# Patient Record
Sex: Female | Born: 2000 | Race: Black or African American | Hispanic: No | Marital: Single | Smoking: Never smoker
Health system: Southern US, Community
[De-identification: ages and names within clinical notes are randomized; demographics above are authoritative.]

## PROBLEM LIST (undated history)

## (undated) DIAGNOSIS — K219 Gastro-esophageal reflux disease without esophagitis: Secondary | ICD-10-CM

## (undated) DIAGNOSIS — IMO0001 Reserved for inherently not codable concepts without codable children: Secondary | ICD-10-CM

## (undated) HISTORY — DX: Gastro-esophageal reflux disease without esophagitis: K21.9

## (undated) HISTORY — DX: Reserved for inherently not codable concepts without codable children: IMO0001

---
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
## 2022-04-15 ENCOUNTER — Ambulatory Visit (HOSPITAL_COMMUNITY)
Admission: EM | Admit: 2022-04-15 | Discharge: 2022-04-15 | Disposition: A | Discharge status: Home / Self Care | Payer: BLUE CROSS/BLUE SHIELD

## 2022-04-15 ENCOUNTER — Encounter (HOSPITAL_COMMUNITY): Payer: Self-pay

## 2022-04-15 DIAGNOSIS — T7840XA Allergy, unspecified, initial encounter: Secondary | ICD-10-CM | POA: Diagnosis not present

## 2022-04-15 MED ORDER — HYDROCORTISONE 1 % EX CREA: TOPICAL_CREAM | CUTANEOUS | 0 refills | Status: AC

## 2022-04-15 MED ORDER — METHYLPREDNISOLONE SODIUM SUCC 125 MG IJ SOLR
INTRAMUSCULAR | Status: AC
  Filled 2022-04-15: qty 2

## 2022-04-15 MED ORDER — METHYLPREDNISOLONE SODIUM SUCC 125 MG IJ SOLR
80.0000 mg | Freq: Once | INTRAMUSCULAR | Status: AC
  Administered 2022-04-15: 125 mg via INTRAMUSCULAR

## 2022-04-15 MED ORDER — PREDNISONE 20 MG PO TABS: 40.0000 mg | ORAL_TABLET | Freq: Every day | ORAL | 0 refills | Status: AC

## 2022-04-15 MED ORDER — CETIRIZINE HCL 10 MG PO TABS: 10.0000 mg | ORAL_TABLET | Freq: Every day | ORAL | 0 refills | Status: AC

## 2022-04-15 MED ORDER — OLOPATADINE HCL 0.1 % OP SOLN: 1.0000 [drp] | Freq: Two times a day (BID) | OPHTHALMIC | 12 refills | Status: AC

## 2022-04-15 NOTE — ED Triage Notes (Signed)
Pt is here for possible allergic reaction to b/p meds since last night . Pt states she has a rash on face , eyes red

## 2022-04-15 NOTE — ED Provider Notes (Signed)
[facility]    CSN: [id number] Arrival date & time: 04/15/22  1222      History   Chief Complaint Chief Complaint  Patient presents with   Allergic Reaction    HPI Tonya West is a 21 y.o. female.  Patient complaining of a generalized rash that started yesterday.  Patient reports she is unaware of what may have caused the rash.  Patient reports itching.  Patient reports bilateral eye redness.  Patient states she was placed on amlodipine several weeks ago but has had no problems with this medication so far.  Patient denies any shortness of breath, chest tightness, or any throat tightness.  Patient reports having a similar episode of symptoms several years ago which was treated as an allergic reaction and resolved, patient states she did not know what caused the allergic reaction that time.  Patient has not used any medications for symptoms.  Patient placed a warm compress on her eyes with no relief of symptoms.    Allergic Reaction Presenting symptoms: rash   Presenting symptoms: no wheezing     Past Medical History:  Diagnosis Date   Reflux     Patient Active Problem List   Diagnosis Date Noted   Diarrhea 08/13/2012   Generalized abdominal pain 08/13/2012   Nausea with vomiting     History reviewed. No pertinent surgical history.  OB History   No obstetric history on file.      Home Medications    Prior to Admission medications   Medication Sig Start Date End Date Taking? Authorizing Provider  amLODipine (NORVASC) 2.5 MG tablet Take 2.5 mg by mouth daily.   Yes [provider]  cetirizine (ZYRTEC ALLERGY) 10 MG tablet Take 1 tablet (10 mg total) by mouth daily. 04/15/22  Yes Debby Freiberg, NP  hydrocortisone cream 1 % Apply to affected area 2 times daily 04/15/22  Yes Kayvion Arneson N, NP  olopatadine (PATADAY) 0.1 % ophthalmic solution Place 1 drop into both eyes 2 (two) times daily. 04/15/22  Yes Debby Freiberg, NP   predniSONE (DELTASONE) 20 MG tablet Take 2 tablets (40 mg total) by mouth daily. 04/15/22  Yes Debby Freiberg, NP    Family History History reviewed. No pertinent family history.  Social History Social History   Tobacco Use   Smoking status: Never   Smokeless tobacco: Never     Allergies   Patient has no known allergies.   Review of Systems Review of Systems  Constitutional:  Negative for activity change, chills, fatigue and fever.  HENT: Negative.    Eyes:  Positive for redness and itching. Negative for photophobia, pain, discharge and visual disturbance.       Denies any trauma to eyes.   Respiratory:  Negative for cough, choking, chest tightness, shortness of breath, wheezing and stridor.   Cardiovascular: Negative.   Skin:  Positive for rash. Negative for color change and wound.  Allergic/Immunologic:       Possible allergies unknown      Physical Exam Triage Vital Signs ED Triage Vitals  Enc Vitals Group     BP 04/15/22 1330 (!) 150/92     Pulse Rate 04/15/22 1330 (!) 103     Resp 04/15/22 1330 12     Temp 04/15/22 1330 98.3 F (36.8 C)     Temp Source 04/15/22 1330 Oral     SpO2 04/15/22 1330 98 %     Weight --      Height --  Head Circumference --      Peak Flow --      Pain Score 04/15/22 1328 0     Pain Loc --      Pain Edu? --      Excl. in GC? --    No data found.  Updated Vital Signs BP (!) 150/92 (BP Location: Left Arm)   Pulse (!) 103   Temp 98.3 F (36.8 C) (Oral)   Resp 12   LMP 03/19/2022   SpO2 98%      Physical Exam Vitals and nursing note reviewed.  Eyes:     General: Lids are normal.        Right eye: No foreign body, discharge or hordeolum.        Left eye: No foreign body, discharge or hordeolum.     Extraocular Movements:     Right eye: Normal extraocular motion.     Left eye: Normal extraocular motion.     Conjunctiva/sclera:     Right eye: Right conjunctiva is not injected. Chemosis present. No exudate or  hemorrhage.    Left eye: Left conjunctiva is not injected. Chemosis present. No exudate or hemorrhage. Skin:    Findings: Rash present. No erythema. Rash is macular and papular. Rash is not pustular or vesicular.     Comments: Generalized rash present on face and bilateral upper extremities.  No tenderness upon palpation.  No drainage from any of the sites where rash is located.   Neurological:     Mental Status: She is alert.      UC Treatments / Results  Labs (all labs ordered are listed, but only abnormal results are displayed) Labs Reviewed - No data to display  EKG   Radiology No results found.  Procedures Procedures (including critical care time)  Medications Ordered in UC Medications  methylPREDNISolone sodium succinate (SOLU-MEDROL) 125 mg/2 mL injection 80 mg (125 mg Intramuscular Given 04/15/22 1355)    Initial Impression / Assessment and Plan / UC Course  I have reviewed the triage vital signs and the nursing notes.  Pertinent labs & imaging results that were available during my care of the patient were reviewed by me and considered in my medical decision making (see chart for details).     Patient was evaluated for an allergic reaction. Solumedrol was given in office. Prednisone, Zyrtec, Hydrocortisone cream, Pataday prescriptions were sent to the pharmacy for allergic reaction and for symptom management. Patient was made aware that she should follow-up with her PCP if symptoms do not resolve, she was made aware that she may need to be seen by an allergist and her PCP can send a referral for this.  Patient was made aware of red flag symptoms that warrant an emergency department visit.  Patient was made aware of timeline for symptom resolution and when follow-up should occur.  Patient verbalized understanding of instructions.   Charting was provided using a a verbal dictation system, charting was proofread for errors, errors may occur which could change the meaning  of the information charted.   Final Clinical Impressions(s) / UC Diagnoses   Final diagnoses:  Allergic reaction, initial encounter     Discharge Instructions      Prednisone taper has been sent to the pharmacy, start taking this medication tomorrow.  Zyrtec has been sent to the pharmacy, this is an antihistamine, you will take 1 tablet daily.  Pataday are eyedrops that were sent to the pharmacy, you will place 1 drop in both  eyes 2 times daily, discontinue use once your symptoms resolve.  Hydrocortisone cream was sent to the pharmacy to help with itching.  As discussed, I think you would be great if you could follow up with your P PCP and have them refer you to an allergist to determine what allergies you may have.   Please follow-up with your PCP if your symptoms do not resolve.   If at any point you develop any shortness of breath, chest tightness, difficulty breathing, or throat tightness please go to the nearest emergency department.      ED Prescriptions     Medication Sig Dispense Auth. Provider   predniSONE (DELTASONE) 20 MG tablet Take 2 tablets (40 mg total) by mouth daily. 12 tablet Debby Freiberg, NP   cetirizine (ZYRTEC ALLERGY) 10 MG tablet Take 1 tablet (10 mg total) by mouth daily. 30 tablet Debby Freiberg, NP   hydrocortisone cream 1 % Apply to affected area 2 times daily 15 g Theresia Pree N, NP   olopatadine (PATADAY) 0.1 % ophthalmic solution Place 1 drop into both eyes 2 (two) times daily. 5 mL Debby Freiberg, NP      PDMP not reviewed this encounter.   Debby Freiberg, NP 04/15/22 1501

## 2022-04-15 NOTE — Discharge Instructions (Addendum)
Prednisone taper has been sent to the pharmacy, start taking this medication tomorrow.  Zyrtec has been sent to the pharmacy, this is an antihistamine, you will take 1 tablet daily.  Pataday are eyedrops that were sent to the pharmacy, you will place 1 drop in both eyes 2 times daily, discontinue use once your symptoms resolve.  Hydrocortisone cream was sent to the pharmacy to help with itching.  As discussed, I think you would be great if you could follow up with your P PCP and have them refer you to an allergist to determine what allergies you may have.   Please follow-up with your PCP if your symptoms do not resolve.   If at any point you develop any shortness of breath, chest tightness, difficulty breathing, or throat tightness please go to the nearest emergency department.
